# Patient Record
Sex: Male | Born: 1995 | Race: White | Hispanic: No | Marital: Single | Smoking: Never smoker
Health system: Southern US, Community
[De-identification: ages and names within clinical notes are randomized; demographics above are authoritative.]

## PROBLEM LIST (undated history)

## (undated) DIAGNOSIS — S5291XA Unspecified fracture of right forearm, initial encounter for closed fracture: Secondary | ICD-10-CM

## (undated) DIAGNOSIS — S060X9A Concussion with loss of consciousness of unspecified duration, initial encounter: Secondary | ICD-10-CM

## (undated) DIAGNOSIS — T50904A Poisoning by unspecified drugs, medicaments and biological substances, undetermined, initial encounter: Secondary | ICD-10-CM

## (undated) DIAGNOSIS — S4991XA Unspecified injury of right shoulder and upper arm, initial encounter: Secondary | ICD-10-CM

## (undated) DIAGNOSIS — S52201A Unspecified fracture of shaft of right ulna, initial encounter for closed fracture: Secondary | ICD-10-CM

## (undated) DIAGNOSIS — L709 Acne, unspecified: Secondary | ICD-10-CM

## (undated) DIAGNOSIS — K208 Other esophagitis: Secondary | ICD-10-CM

## (undated) HISTORY — DX: Unspecified fracture of shaft of right ulna, initial encounter for closed fracture: S52.201A

## (undated) HISTORY — DX: Unspecified fracture of right forearm, initial encounter for closed fracture: S52.91XA

## (undated) HISTORY — DX: Unspecified injury of right shoulder and upper arm, initial encounter: S49.91XA

## (undated) HISTORY — DX: Acne, unspecified: L70.9

## (undated) HISTORY — DX: Poisoning by unspecified drugs, medicaments and biological substances, undetermined, initial encounter: T50.904A

## (undated) HISTORY — DX: Other esophagitis: K20.8

## (undated) HISTORY — DX: Concussion with loss of consciousness of unspecified duration, initial encounter: S06.0X9A

---
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
## 2016-01-29 ENCOUNTER — Ambulatory Visit (INDEPENDENT_AMBULATORY_CARE_PROVIDER_SITE_OTHER): Payer: BLUE CROSS/BLUE SHIELD | Admitting: Internal Medicine

## 2016-01-29 ENCOUNTER — Encounter: Payer: Self-pay | Admitting: Internal Medicine

## 2016-01-29 VITALS — BP 131/75 | Ht 71.0 in | Wt 175.0 lb

## 2016-01-29 DIAGNOSIS — F4323 Adjustment disorder with mixed anxiety and depressed mood: Secondary | ICD-10-CM

## 2016-01-29 DIAGNOSIS — F902 Attention-deficit hyperactivity disorder, combined type: Secondary | ICD-10-CM | POA: Diagnosis not present

## 2016-01-31 NOTE — Progress Notes (Signed)
Adolescent Clinic Followup  From early in 1st semester senior yr at [employer] [facility]'s grades dropped and he ended with 2 D's and 2 B's. He was somewhat oppositional at home, spent a lot of time with his girlfriend and with football, didn't study, failed to turned things in. Was often discouraged to point of not caring. IB math the most difficult. But he has all college req x english and a good enough overall GPA for a full scholarship offer to [employer] for [employer].Will not be able to get into [employer] or [employer].   Mom is concerned about his psyche but J feels like things are turning around ove rthe last few weeks. Still anxious at being overwhelmed with school. Is trying to get out of this math class which he does not even need to graduate but the school is refusing--they meet with higher ups this week.  Throughout the fall there were periods of reactive emotions that interfered with studying, relationships-espec with parents, sleep, etc, and stalled his considerations of the future.   He continues to use Adderall infrequently(hates side effects) and only for prolonged tasks like writing of papers or studying for multiple tests. Classroom destractibility and retaining lectures not problematic at this point.  Mom is locked in a struggle feeling she needs to ride him to get everything done, and J interprets this as very negative, feeling hurt and then angry. This is driving them apart, and leaving Mom frustrated/upset.  Noone from couns at [facility] has ever discussed college or doing poorly in IB with him. Football was not the best experience this yr despite the st champ run. He is looking forward to lacrosse. He has not even responded to the [employer] offer tho it came in October.   See [name] ADD eval 2015 for reference See concussion fall 2016--no sequelae suspected  IMP 1 adolescent adjustment reaction 2 attention deficit disorder  PLAN -drop IB math or get tutor weekly -cont adderall prn--he has meds now from  last spring -contact [employer] and consider other similar small environments at least for year one -contract with he and Mom--she has no responsibility for his tasks unless he asks for help for next 2 weeks (she needs to believe that he can take responsibility --and fail if he doesn't while he needs to discover that Mom is really for him, not against him)  Fu 1 month

## 2016-02-19 ENCOUNTER — Ambulatory Visit (INDEPENDENT_AMBULATORY_CARE_PROVIDER_SITE_OTHER): Payer: BLUE CROSS/BLUE SHIELD | Admitting: Internal Medicine

## 2016-02-19 ENCOUNTER — Encounter: Payer: Self-pay | Admitting: Internal Medicine

## 2016-02-19 VITALS — BP 107/58 | Ht 71.0 in | Wt 175.0 lb

## 2016-02-19 DIAGNOSIS — F902 Attention-deficit hyperactivity disorder, combined type: Secondary | ICD-10-CM

## 2016-02-19 DIAGNOSIS — F4323 Adjustment disorder with mixed anxiety and depressed mood: Secondary | ICD-10-CM | POA: Diagnosis not present

## 2016-02-19 MED ORDER — AMPHETAMINE-DEXTROAMPHETAMINE 10 MG PO TABS: 10.0000 mg | ORAL_TABLET | Freq: Two times a day (BID) | ORAL | Status: DC

## 2016-02-19 NOTE — Progress Notes (Signed)
Follow-up in adolescent clinic Note last visit He continues to struggle with IB math, a course he does not need to graduate or qualify for college  Appeal to his counselors, principal and central resources [employer] administration have been unhelpful His stress level is significant--there are many times where he feels overwhelmed and felt safe2 that prevents him from beginning academic assignments and social activities. He is able to recover on his own but these episodes are several times a week. He continues to feel as if his mother personalizes his anxiousness as if it is an attack on her-he has certainly remorseful for his anger toward her and the bad feelings that it creates.  Mom reports that she is fearful that he is not doing enough work to pass and not applying himself to the needs for admission to college. He is now interested in [place] where his girlfriend is, and is remotely considering [employer] where he has a full scholarship for lacrosse. His lacrosse is taking told based on time commitment with weight lifting, review of film, and then practice often lasting until 7:30 at night. Homework is crammed into the time for bed while he is very exhausted.  In a separate interview with him alone he recognizes that he will most likely not get into colleges like [employer] or state and that he may even have to go to [employer] to get the requirements necessary to transfer to a better school. He is currently willing to live with that decision on his part although he prefers to try and accomplish all of his work to complete the semester. He expresses the need for his parents to allow him to set his own schedule for this.  He has finally tried to take his Adderall in the morning and does in fact find it helpful without side effects  IMP:  ADHD (attention deficit hyperactivity disorder), combined type  Adjustment disorder with mixed anxiety and depressed mood  Meds ordered this encounter   Medications  . amphetamine-dextroamphetamine (ADDERALL) 10 MG tablet    Sig: Take 1 tablet (10 mg total) by mouth 2 (two) times daily with a meal.    Dispense:  60 tablet    Refill:  0   He has medication left over and mother is given this prescription in case he runs out for follow-up A letter is written recommended dropping math class Mother and son are directed to establish contracts regarding expectations with rewards for freedom to choose activities She is correcting her expectations for behaviors that do not hurt the family unit He is correct in expecting more trust that he will make the correct decision They both need to agree on adverse outcomes that may require him to attend [employer] Follow-up one month

## 2016-03-15 ENCOUNTER — Encounter: Payer: Self-pay | Admitting: Family

## 2016-03-15 ENCOUNTER — Ambulatory Visit (INDEPENDENT_AMBULATORY_CARE_PROVIDER_SITE_OTHER): Payer: BLUE CROSS/BLUE SHIELD | Admitting: Family

## 2016-03-15 VITALS — Wt 178.0 lb

## 2016-03-15 DIAGNOSIS — J0110 Acute frontal sinusitis, unspecified: Secondary | ICD-10-CM | POA: Diagnosis not present

## 2016-03-15 MED ORDER — AMOXICILLIN 500 MG PO CAPS: 500.0000 mg | ORAL_CAPSULE | Freq: Two times a day (BID) | ORAL | Status: DC

## 2016-03-15 NOTE — Patient Instructions (Signed)

## 2016-03-15 NOTE — Progress Notes (Signed)
Subjective:     William Parsons is a 20 y.o. male who presents for evaluation of sinus pain. Symptoms include: congestion, cough, headaches, mouth breathing, nasal congestion, post nasal drip, puffiness of the eyes and sinus pressure. Onset of symptoms was 3 weeks ago. Symptoms have been gradually worsening since that time.  Patient is a non-smoker. Describes headache that starts with his left frontal sinus, last for an hour or two, then goes away. Ibuprofen as been helpful with headache. Denies dizziness, changes in vision, weakness.   The following portions of the patient's history were reviewed and updated as appropriate: allergies, current medications, past family history, past medical history, past social history, past surgical history and problem list.  Review of Systems Pertinent items noted in HPI and remainder of comprehensive ROS otherwise negative.   Objective:    General appearance: alert and cooperative Head: Normocephalic, without obvious abnormality, atraumatic Ears: normal TM's and external ear canals both ears Nose: moderate congestion, sinus tenderness left Throat: lips, mucosa, and tongue normal; teeth and gums normal Lungs: clear to auscultation bilaterally and normal percussion bilaterally Heart: regular rate and rhythm, S1, S2 normal, no murmur, click, rub or gallop Lymph nodes: Cervical, supraclavicular, and axillary nodes normal.    Assessment:    Acute bacterial sinusitis.    Plan:  - Amoxicillin BID x 10 days for sinusitis due to duration and severity of symptoms.  - Flonase 1 puff in each nostril BID, Zyrtec daily  - Discussed headaches, if continue, then keep headache journal for two weeks then follow up  - Follow up as needed.

## 2016-03-18 ENCOUNTER — Ambulatory Visit (INDEPENDENT_AMBULATORY_CARE_PROVIDER_SITE_OTHER): Payer: BLUE CROSS/BLUE SHIELD | Admitting: Internal Medicine

## 2016-03-18 ENCOUNTER — Encounter: Payer: Self-pay | Admitting: Internal Medicine

## 2016-03-18 VITALS — BP 128/60 | Ht 71.0 in | Wt 175.0 lb

## 2016-03-18 DIAGNOSIS — F902 Attention-deficit hyperactivity disorder, combined type: Secondary | ICD-10-CM

## 2016-03-18 DIAGNOSIS — F4323 Adjustment disorder with mixed anxiety and depressed mood: Secondary | ICD-10-CM | POA: Diagnosis not present

## 2016-03-20 NOTE — Progress Notes (Signed)
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 He is almost caught up and all his other subjects and expects to pass and do well completing this senior year. He has been for an initial interview at [employer] and was told informally that he will be accepted. They are discussing scholarship money. He hopes to play club lacrosse at college rather than varsity and to focus more on studies. All of this has greatly improved his relationship with his mother and she reports that they are working well together now much less arguing.  He rarely needs Adderall and in fact his written a paper for his English class about this drug and its potential misuses and side effects. He does not like the overfocus and the irritability and hopes to use this very seldom.  He plans to transition his regular medical care from his pediatrician to urgent medical and family care on [place] and I have suggested providers there

## 2017-12-29 HISTORY — PX: WISDOM TOOTH EXTRACTION: SHX21

## 2018-01-03 ENCOUNTER — Telehealth: Payer: Self-pay | Admitting: *Deleted

## 2018-01-03 ENCOUNTER — Encounter: Payer: Self-pay | Admitting: Family Medicine

## 2018-01-03 ENCOUNTER — Ambulatory Visit: Payer: BLUE CROSS/BLUE SHIELD | Admitting: Family Medicine

## 2018-01-03 VITALS — BP 110/80 | HR 89 | Temp 98.7°F | Ht 71.0 in | Wt 208.6 lb

## 2018-01-03 DIAGNOSIS — Z79899 Other long term (current) drug therapy: Secondary | ICD-10-CM | POA: Diagnosis not present

## 2018-01-03 DIAGNOSIS — E663 Overweight: Secondary | ICD-10-CM | POA: Diagnosis not present

## 2018-01-03 DIAGNOSIS — F902 Attention-deficit hyperactivity disorder, combined type: Secondary | ICD-10-CM | POA: Diagnosis not present

## 2018-01-03 MED ORDER — AMPHETAMINE-DEXTROAMPHETAMINE 10 MG PO TABS: 10.0000 mg | ORAL_TABLET | Freq: Two times a day (BID) | ORAL | 0 refills | Status: DC

## 2018-01-03 NOTE — Assessment & Plan Note (Signed)
S:had significant increase in weight from 185 to 215 freshman year college.   He noted this was an issue and has cut out alcohol and eating at dining hall. already down 7 lbs- cooking own meals now A/P: congratulated on progress but encouraged continued weight loss through healthy lifestyle choices.

## 2018-01-03 NOTE — Assessment & Plan Note (Signed)
S: Per records: Psych evaluation [facility] January 2015 led to diagnosis. Patient has done really well with  Adderall 10mg  BID prn. He was managed by Dr. Merla Richesoolittle. At this point he rarely has to use unless he has prolonged period of work at school- otherwise he is able to reasonably manage. 60 pills usually lasts him over 6 months.  A/P: William MuirJamie will help him complete controlled substance contract. He agrees to UDS today. Refilled adderall 10mg  BID #60 and advised CPE in 6-12 months for repeat evaluation/refill and get some baseline bloodwork.

## 2018-01-03 NOTE — Patient Instructions (Addendum)
Drop off urine before you go  Fill out controlled substance contract- William MuirJamie will be in shortly  Refilled Adderall. Check in with me 6-12 months for follow up visit and we can get some baseline bloodwork/do a physical

## 2018-01-03 NOTE — Telephone Encounter (Signed)
Copied from CRM [id number]. Topic: General - Other >> Jan 03, 2018 12:00 PM Windy KalataMichael, Taylor L, NT wrote: Reason for CRM: patient states he was just seen today and he forgot to let Dr. Durene CalHunter know he has been taking hydrocodone and ketorolac for pain that he received when he got his wisdom teeth out. He wanted to make sure there was not any confusion since he did a urine test today

## 2018-01-03 NOTE — Telephone Encounter (Signed)
Cassie- thanks for the update- will interpret test in light of this

## 2018-01-03 NOTE — Progress Notes (Signed)
Phone: [phone]  Subjective:  Patient presents today to establish care.  Prior patient of Dr. Barney Drainamgoolam of [facility] and Dr. Merla Richesoolittle for ADD. Chief complaint-noted.   See problem oriented charting  The following were reviewed and entered/updated in epic: Past Medical History:  Diagnosis Date  . Acne   . Concussion 01/2012   seen by neurology  . Esophagitis due to doxycycline 2013  . Fracture of radius with ulna, right, closed 12/2012  . [facility] 2013   [facility]   Patient Active Problem List   Diagnosis Date Noted  . ADHD (attention deficit hyperactivity disorder), combined type 12/01/2013    Priority: High  . Mild concussion 09/21/2015    Priority: Low  . Acne 03/15/2013    Priority: Low  . Back pain 03/14/2013    Priority: Low  . Esophagitis due to doxycycline 10/31/2012    Priority: Low  . Overweight 01/03/2018   Past Surgical History:  Procedure Laterality Date  . WISDOM TOOTH EXTRACTION Bilateral 12/29/2017    Family History  Problem Relation Age of Onset  . Learning disabilities Mother   . Healthy Father   . Healthy Sister   . Healthy Brother   . Learning disabilities Maternal Grandmother   . Diabetes Maternal Grandfather   . Diabetes Paternal Grandmother   . Diabetes Paternal Grandfather     Medications- reviewed and updated Current Outpatient Medications  Medication Sig Dispense Refill  . amphetamine-dextroamphetamine (ADDERALL) 10 MG tablet Take 1 tablet (10 mg total) by mouth 2 (two) times daily with a meal. 60 tablet 0   Allergies-reviewed and updated Allergies  Allergen Reactions  . Doxycycline     esophagitis    Social History   Socioeconomic History  . Marital status: Single    Spouse name: None  . Number of children: None  . Years of education: None  . Highest education level: None  Social Needs  . Financial resource strain: None  . Food insecurity - worry: None  . Food insecurity - inability: None    . Transportation needs - medical: None  . Transportation needs - non-medical: None  Occupational History  . None  Tobacco Use  . Smoking status: Never Smoker  . Smokeless tobacco: Never Used  Substance and Sexual Activity  . Alcohol use: No  . Drug use: No  . Sexual activity: No    Birth control/protection: Condom  Other Topics Concern  . None  Social History Narrative   Single. 3 roommates at college   Lives at home with mom, dad, little brother and little sister      At [employer]- sophomore 2019.    Looking at transferring to [employer]- better friend network and better for business   He is a finance/business major      Hobbies: Loves intramural sports, enjoys hiking, youtube- day in the life things- pranks/tricks    ROS--Full ROS was completed Review of Systems  Constitutional: Negative for chills and fever.  HENT: Negative for hearing loss and tinnitus.   Eyes: Negative for blurred vision and double vision.  Respiratory: Negative for cough and hemoptysis.   Cardiovascular: Negative for chest pain and palpitations.  Gastrointestinal: Negative for abdominal pain and diarrhea.  Genitourinary: Negative for dysuria and urgency.  Musculoskeletal: Negative for myalgias and neck pain.  Skin: Negative for itching and rash.  Neurological: Negative for dizziness and headaches.       Attention issues from ADD  Endo/Heme/Allergies: Negative for polydipsia. Does  not bruise/bleed easily.  Psychiatric/Behavioral: Negative for hallucinations and substance abuse.   Objective: BP 110/80 (BP Location: Left Arm, Patient Position: Sitting, Cuff Size: Large)   Pulse 89   Temp 98.7 F (37.1 C) (Oral)   Ht 5\' 11"  (1.803 m)   Wt 208 lb 9.6 oz (94.6 kg)   SpO2 96%   BMI 29.09 kg/m  Gen: NAD, resting comfortably HEENT: Mucous membranes are moist. Oropharynx normal. TM normal. Eyes: sclera and lids normal, PERRLA Neck: no thyromegaly, no cervical lymphadenopathy CV: RRR no murmurs rubs  or gallops Lungs: CTAB no crackles, wheeze, rhonchi Abdomen: soft/nontender/nondistended/normal bowel sounds. No rebound or guarding. overweight Ext: no edema Skin: warm, dry Neuro: 5/5 strength in upper and lower extremities, normal gait, normal reflexes  Assessment/Plan:  Other- always uses protection when has sex but no recent sex  ADHD (attention deficit hyperactivity disorder), combined type S: Per records: Psych evaluation [facility] January 2015 led to diagnosis. Patient has done really well with  Adderall 10mg  BID prn. He was managed by Dr. Merla Riches. At this point he rarely has to use unless he has prolonged period of work at school- otherwise he is able to reasonably manage. 60 pills usually lasts him over 6 months.  A/P: Asher Muir will help him complete controlled substance contract. He agrees to UDS today. Refilled adderall 10mg  BID #60 and advised CPE in 6-12 months for repeat evaluation/refill and get some baseline bloodwork.    Overweight S:had significant increase in weight from 185 to 215 freshman year college.   He noted this was an issue and has cut out alcohol and eating at dining hall. already down 7 lbs- cooking own meals now A/P: congratulated on progress but encouraged continued weight loss through healthy lifestyle choices.    6-12 months CPE   Orders Placed This Encounter  Procedures  . Pain Mgmt, Profile 8 w/Conf, U   Meds ordered this encounter  Medications  . amphetamine-dextroamphetamine (ADDERALL) 10 MG tablet    Sig: Take 1 tablet (10 mg total) by mouth 2 (two) times daily with a meal.    Dispense:  60 tablet    Refill:  0   Return precautions advised. Tana Conch, MD

## 2018-01-06 LAB — PAIN MGMT, PROFILE 8 W/CONF, U
6 Acetylmorphine: NEGATIVE ng/mL (ref <10)
ALCOHOL METABOLITES: NEGATIVE ng/mL (ref <500)
Amphetamines: NEGATIVE ng/mL (ref <500)
BENZODIAZEPINES: NEGATIVE ng/mL (ref <100)
BUPRENORPHINE, URINE: NEGATIVE ng/mL (ref <5)
Cocaine Metabolite: NEGATIVE ng/mL (ref <150)
Codeine: NEGATIVE ng/mL (ref <50)
Creatinine: 61.5 mg/dL
HYDROMORPHONE: 51 ng/mL — AB (ref <50)
Hydrocodone: 54 ng/mL — ABNORMAL HIGH (ref <50)
MDMA: NEGATIVE ng/mL (ref <500)
MORPHINE: NEGATIVE ng/mL (ref <50)
Marijuana Metabolite: NEGATIVE ng/mL (ref <20)
Norhydrocodone: 99 ng/mL — ABNORMAL HIGH (ref <50)
Opiates: POSITIVE ng/mL — AB (ref <100)
Oxidant: NEGATIVE ug/mL (ref <200)
Oxycodone: NEGATIVE ng/mL (ref <100)
pH: 6.91 (ref 4.5–9.0)

## 2018-05-10 ENCOUNTER — Encounter: Payer: Self-pay | Admitting: Physician Assistant

## 2018-05-10 ENCOUNTER — Ambulatory Visit: Payer: BLUE CROSS/BLUE SHIELD | Admitting: Physician Assistant

## 2018-05-10 VITALS — BP 140/76 | HR 92 | Temp 99.1°F | Ht 71.0 in | Wt 191.4 lb

## 2018-05-10 DIAGNOSIS — J029 Acute pharyngitis, unspecified: Secondary | ICD-10-CM

## 2018-05-10 LAB — POCT RAPID STREP A (OFFICE): Rapid Strep A Screen: NEGATIVE

## 2018-05-10 MED ORDER — IBUPROFEN 600 MG PO TABS: 600.0000 mg | ORAL_TABLET | Freq: Four times a day (QID) | ORAL | 0 refills | Status: DC | PRN

## 2018-05-10 NOTE — Progress Notes (Signed)
William Parsons is a 22 y.o. male here for a new problem.  I acted as a Neurosurgeon for [employer], PA-C Corky Mull, LPN  History of Present Illness:   Chief Complaint  Patient presents with  . Sore Throat    Sore Throat   This is a new problem. The current episode started yesterday (Pt has been feeling bad since Sunday night with fatigue and Fever and then sore throat started ). The problem has been gradually worsening. The maximum temperature recorded prior to his arrival was 101 - 101.9 F. The fever has been present for 1 to 2 days. The pain is at a severity of 5/10. The pain is moderate. Associated symptoms include coughing (slight), headaches and trouble swallowing. Pertinent negatives include no ear pain, neck pain or shortness of breath. He has tried cool liquids (Ibuprofen took last at 4:30 AM) for the symptoms. The treatment provided mild relief.    Past Medical History:  Diagnosis Date  . Acne   . Concussion 01/2012   seen by neurology  . Esophagitis due to doxycycline 2013  . Fracture of radius with ulna, right, closed 12/2012  . Right shoulder injury 2013   [facility]     Social History   Socioeconomic History  . Marital status: Single    Spouse name: Not on file  . Number of children: Not on file  . Years of education: Not on file  . Highest education level: Not on file  Occupational History  . Not on file  Social Needs  . Financial resource strain: Not on file  . Food insecurity:    Worry: Not on file    Inability: Not on file  . Transportation needs:    Medical: Not on file    Non-medical: Not on file  Tobacco Use  . Smoking status: Never Smoker  . Smokeless tobacco: Never Used  Substance and Sexual Activity  . Alcohol use: No  . Drug use: No  . Sexual activity: Never    Birth control/protection: Condom  Lifestyle  . Physical activity:    Days per week: Not on file    Minutes per session: Not on file  . Stress: Not on file  Relationships   . Social connections:    Talks on phone: Not on file    Gets together: Not on file    Attends religious service: Not on file    Active member of club or organization: Not on file    Attends meetings of clubs or organizations: Not on file    Relationship status: Not on file  . Intimate partner violence:    Fear of current or ex partner: Not on file    Emotionally abused: Not on file    Physically abused: Not on file    Forced sexual activity: Not on file  Other Topics Concern  . Not on file  Social History Narrative   Single. 3 roommates at college   Lives at home with mom, dad, little brother and little sister      At [employer]- sophomore 2019.    Looking at transferring to [employer]- better friend network and better for business   He is a finance/business major      Hobbies: Loves intramural sports, enjoys hiking, youtube- day in the life things- pranks/tricks    Past Surgical History:  Procedure Laterality Date  . WISDOM TOOTH EXTRACTION Bilateral 12/29/2017    Family History  Problem Relation Age of  Onset  . Learning disabilities Mother   . Healthy Father   . Healthy Sister   . Healthy Brother   . Learning disabilities Maternal Grandmother   . Diabetes Maternal Grandfather   . Diabetes Paternal Grandmother   . Diabetes Paternal Grandfather     Allergies  Allergen Reactions  . Doxycycline     esophagitis    Current Medications:   Current Outpatient Medications:  .  amphetamine-dextroamphetamine (ADDERALL) 10 MG tablet, Take 1 tablet (10 mg total) by mouth 2 (two) times daily with a meal., Disp: 60 tablet, Rfl: 0 .  ibuprofen (ADVIL,MOTRIN) 600 MG tablet, Take 1 tablet (600 mg total) by mouth every 6 (six) hours as needed., Disp: 30 tablet, Rfl: 0   Review of Systems:   Review of Systems  HENT: Positive for trouble swallowing. Negative for ear pain.   Respiratory: Positive for cough (slight). Negative for shortness of breath.   Musculoskeletal: Negative  for neck pain.  Neurological: Positive for headaches.    Vitals:   Vitals:   05/10/18 1034  BP: 140/76  Pulse: 92  Temp: 99.1 F (37.3 C)  TempSrc: Oral  SpO2: 97%  Weight: 191 lb 6.1 oz (86.8 kg)  Height:  (1.803 m)     Body mass index is 26.69 kg/m.  Physical Exam:   Physical Exam  Constitutional: He appears well-developed. He is cooperative.  Non-toxic appearance. He does not have a sickly appearance. He does not appear ill. No distress.  HENT:  Head: Normocephalic and atraumatic.  Right Ear: Tympanic membrane, external ear and ear canal normal. Tympanic membrane is not erythematous, not retracted and not bulging.  Left Ear: Tympanic membrane, external ear and ear canal normal. Tympanic membrane is not erythematous, not retracted and not bulging.  Nose: Nose normal. Right sinus exhibits no maxillary sinus tenderness and no frontal sinus tenderness. Left sinus exhibits no maxillary sinus tenderness and no frontal sinus tenderness.  Mouth/Throat: Uvula is midline and mucous membranes are normal. Posterior oropharyngeal edema and posterior oropharyngeal erythema present. Tonsils are 2+ on the right. Tonsils are 2+ on the left.  Eyes: Conjunctivae and lids are normal.  Neck: Trachea normal.  Cardiovascular: Normal rate, regular rhythm, S1 normal, S2 normal and normal heart sounds.  Pulmonary/Chest: Effort normal and breath sounds normal. He has no decreased breath sounds. He has no wheezes. He has no rhonchi. He has no rales.  Lymphadenopathy:    He has no cervical adenopathy.  Neurological: He is alert.  Skin: Skin is warm, dry and intact.  Psychiatric: He has a normal mood and affect. His speech is normal and behavior is normal.  Nursing note and vitals reviewed.  Results for orders placed or performed in visit on 05/10/18  POCT rapid strep A  Result Value Ref Range   Rapid Strep A Screen Negative Negative     Assessment and Plan:    Donna was seen today  for sore throat.  Diagnoses and all orders for this visit:  Pharyngitis, unspecified etiology -     POCT rapid strep A  Other orders -     ibuprofen (ADVIL,MOTRIN) 600 MG tablet; Take 1 tablet (600 mg total) by mouth every 6 (six) hours as needed.   No red flags on exam. Suspect viral etiology. I offered testing for mono but he declined.  Will initiate symptomatic care per orders -- discussed use of ibuprofen (I did provide patient with a prescription of 600 mg ibuprofen.) Discussed taking medications as  prescribed. Reviewed return precautions including worsening fever, SOB, worsening cough or other concerns. Push fluids and rest. I recommend that patient follow-up if symptoms worsen or persist despite treatment x 7-10 days, sooner if needed.   . Reviewed expectations re: course of current medical issues. . Discussed self-management of symptoms. . Outlined signs and symptoms indicating need for more acute intervention. . Patient verbalized understanding and all questions were answered. . See orders for this visit as documented in the electronic medical record. . Patient received an After-Visit Summary.  CMA or LPN served as scribe during this visit. History, Physical, and Plan performed by medical provider. Documentation and orders reviewed and attested to.  Jarold Motto, PA-C

## 2018-05-10 NOTE — Patient Instructions (Addendum)
It was great to see you!  Use medication as prescribed: ibuprofen Push fluids and get plenty of rest. Please return if you are not improving as expected, or if you have high fevers (>101.5) or difficulty swallowing or worsening productive cough.  Call clinic with questions.  I hope you start feeling better soon!

## 2018-07-18 ENCOUNTER — Ambulatory Visit: Payer: BLUE CROSS/BLUE SHIELD | Admitting: Family Medicine

## 2018-07-18 ENCOUNTER — Encounter: Payer: Self-pay | Admitting: Family Medicine

## 2018-07-18 VITALS — BP 110/74 | HR 69 | Temp 98.4°F | Ht 71.0 in | Wt 191.0 lb

## 2018-07-18 DIAGNOSIS — F902 Attention-deficit hyperactivity disorder, combined type: Secondary | ICD-10-CM | POA: Diagnosis not present

## 2018-07-18 DIAGNOSIS — H60332 Swimmer's ear, left ear: Secondary | ICD-10-CM

## 2018-07-18 DIAGNOSIS — M545 Low back pain, unspecified: Secondary | ICD-10-CM

## 2018-07-18 MED ORDER — NEOMYCIN-POLYMYXIN-HC 3.5-10000-1 OT SOLN: 4.0000 [drp] | Freq: Four times a day (QID) | OTIC | 0 refills | Status: DC

## 2018-07-18 MED ORDER — AMPHETAMINE-DEXTROAMPHETAMINE 10 MG PO TABS: 10.0000 mg | ORAL_TABLET | Freq: Two times a day (BID) | ORAL | 0 refills | Status: DC

## 2018-07-18 NOTE — Assessment & Plan Note (Signed)
S: Patient is about to run out of #60 of Adderall he was given 6 months ago.  He does not use medicine for frequently in the summer but is about to go back to school where he will use more frequently.  He has a hard semester upcoming and suspects he will need to use the medication more regularly.  Medication is continued to work well for him without side effects A/P: Good control-refill for 3 months of medication.  He agrees to pick this up at local pharmacy each time refill needed.  I suspect these 3 prescriptions may last him 6 months even. -UDS completed last visit.  On opiates for dental procedure - Controlled substance contract signed last visit. -Original diagnosis through [facility] January 2015

## 2018-07-18 NOTE — Progress Notes (Signed)
Subjective:  William Parsons is a 22 y.o. year old very pleasant male patient who presents for/with See problem oriented charting ROS-notes left ear pain.  No fever or chills.  Mild hoarse voice.  Denies sinus pressure and nasal congestion.  Past Medical History-  Patient Active Problem List   Diagnosis Date Noted  . ADHD (attention deficit hyperactivity disorder), combined type 12/01/2013    Priority: High  . Mild concussion 09/21/2015    Priority: Low  . Acne 03/15/2013    Priority: Low  . Back pain 03/14/2013    Priority: Low  . Esophagitis due to doxycycline 10/31/2012    Priority: Low  . Overweight 01/03/2018    Medications- reviewed and updated Current Outpatient Medications  Medication Sig Dispense Refill  . amphetamine-dextroamphetamine (ADDERALL) 10 MG tablet Take 1 tablet (10 mg total) by mouth 2 (two) times daily with a meal. 60 tablet 0  . ibuprofen (ADVIL,MOTRIN) 600 MG tablet Take 1 tablet (600 mg total) by mouth every 6 (six) hours as needed. 30 tablet 0   No current facility-administered medications for this visit.     Objective: BP 110/74 (BP Location: Left Arm, Patient Position: Sitting, Cuff Size: Normal)   Pulse 69   Temp 98.4 F (36.9 C) (Oral)   Ht 5\' 11"  (1.803 m)   Wt 191 lb (86.6 kg)   SpO2 98%   BMI 26.64 kg/m  Gen: NAD, resting comfortably Tm normal on right as is canal, on left side- there is erythema and edema in canal as well as light white discharge.  CV: RRR no murmurs rubs or gallops Lungs: CTAB no crackles, wheeze, rhonchi Ext: no edema  Back - Normal skin, Spine with normal alignment and no deformity.  No tenderness to vertebral process palpation.  Paraspinous muscles are not tender and without spasm on right, on left in low back there is some tenderness and spasm.   Range of motion is full at neck and lumbar sacral regions.  Neuro- no saddle anesthesia, 5/5 strength lower extremities  Assessment/Plan:  Left low back pain S:  February tweaked back- started with heat and seemed to make him feel too loose  Yesterday noted pain in same area. Threw a 160 lb friend in the pool and felt immediate pain. Pain up to 5-6/10 in left low back. Today pain is 3/10 but stiff.  A/P: 22 year old with low back strain. No red flags such as incontinence or leg weakness or midline pain. we discussed relative rest for next 2-3 weeks. I want you to ice your left low back 3x a day for 20 minutes. After 3 days I want you to try heat 3x a day for 20 minutes.   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 [place] Jumped in pool 4-5 days ago and within a day noted some pressure in left ear- this has increased through today. Hurts with pressing on tragus A/P:  ADHD (attention deficit hyperactivity disorder), combined type S: Patient is about to run out of #60 of Adderall he was given 6 months ago.  He does not use medicine for frequently in the summer but is about to go back to school where he will use more frequently.  He has a hard semester upcoming and suspects he will need to use the medication more regularly.  Medication is continued to work well for him without side effects A/P: Good control-refill for 3 months  of medication.  He agrees to pick this up at local pharmacy each time refill needed.  I suspect these 3 prescriptions may last him 6 months even. -UDS completed last visit.  On opiates for dental procedure - Controlled substance contract signed last visit. -Original diagnosis through [facility] January 2015   Meds ordered this encounter  Medications  . neomycin-polymyxin-hydrocortisone (CORTISPORIN) OTIC solution    Sig: Place 4 drops into the left ear 4 (four) times daily.    Dispense:  10 mL    Refill:  0  . amphetamine-dextroamphetamine (ADDERALL) 10 MG tablet    Sig: Take 1 tablet (10 mg total) by mouth 2 (two) times daily.    Dispense:  60 tablet     Refill:  0  . amphetamine-dextroamphetamine (ADDERALL) 10 MG tablet    Sig: Take 1 tablet (10 mg total) by mouth 2 (two) times daily.    Dispense:  60 tablet    Refill:  0  . amphetamine-dextroamphetamine (ADDERALL) 10 MG tablet    Sig: Take 1 tablet (10 mg total) by mouth 2 (two) times daily.    Dispense:  60 tablet    Refill:  0    Return precautions advised.  Tana Conch, MD

## 2018-07-18 NOTE — Patient Instructions (Addendum)
I want you to ice your left low back 3x a day for 20 minutes. After 3 days I want you to try heat 3x a day for 20 minutes.   Use ear drops 7-10 days. See us back if not improving by the end of the week  Refilled ADD meds today  Hope you have a great school year!

## 2019-07-20 DIAGNOSIS — Z20828 Contact with and (suspected) exposure to other viral communicable diseases: Secondary | ICD-10-CM | POA: Diagnosis not present

## 2019-07-23 ENCOUNTER — Other Ambulatory Visit: Payer: Self-pay | Admitting: Family Medicine

## 2020-06-16 DIAGNOSIS — J00 Acute nasopharyngitis [common cold]: Secondary | ICD-10-CM | POA: Diagnosis not present

## 2020-06-16 DIAGNOSIS — R05 Cough: Secondary | ICD-10-CM | POA: Diagnosis not present

## 2020-07-18 ENCOUNTER — Other Ambulatory Visit: Payer: Self-pay | Admitting: Family Medicine

## 2020-07-18 NOTE — Telephone Encounter (Signed)
Rx refill request for Adderall 10 mg Tabs  LAST APPOINTMENT DATE: 07/18/2018   NEXT APPOINTMENT DATE: Visit date not found    LAST REFILL: 07/18/2018  QTY: 60 tablets

## 2020-07-21 NOTE — Telephone Encounter (Signed)
Pt needs OV In order for medication refill.

## 2020-07-21 NOTE — Telephone Encounter (Signed)
Needs visit per refill protocol. Please review protocol William Parsons. Thanks for helping out

## 2020-07-24 NOTE — Telephone Encounter (Signed)
Patient is scheduled for December 28th for a physical, next available for an appointment is not going to be until October. William Parsons asked if there is a way that he can get a refill to last until December, states that he is starting school back in August.

## 2020-07-24 NOTE — Telephone Encounter (Signed)
Please see if he can do a 10 AM visit on the 30th/Friday or 11:40 AM on Monday?

## 2020-07-25 NOTE — Telephone Encounter (Signed)
LVM asking patient to return my call -asking patient to come in on Monday.

## 2020-07-25 NOTE — Telephone Encounter (Signed)
Please call the patient to see if he is able to do a visit on Monday at 11:40 in order to get a refill.

## 2020-07-28 ENCOUNTER — Other Ambulatory Visit: Payer: Self-pay

## 2020-07-28 ENCOUNTER — Ambulatory Visit (INDEPENDENT_AMBULATORY_CARE_PROVIDER_SITE_OTHER): Payer: BC Managed Care – PPO | Admitting: Family Medicine

## 2020-07-28 ENCOUNTER — Encounter: Payer: Self-pay | Admitting: Family Medicine

## 2020-07-28 VITALS — BP 112/70 | HR 76 | Temp 99.3°F | Resp 18 | Ht 71.0 in | Wt 199.2 lb

## 2020-07-28 DIAGNOSIS — Z1159 Encounter for screening for other viral diseases: Secondary | ICD-10-CM | POA: Diagnosis not present

## 2020-07-28 DIAGNOSIS — F988 Other specified behavioral and emotional disorders with onset usually occurring in childhood and adolescence: Secondary | ICD-10-CM

## 2020-07-28 DIAGNOSIS — Z9189 Other specified personal risk factors, not elsewhere classified: Secondary | ICD-10-CM | POA: Diagnosis not present

## 2020-07-28 DIAGNOSIS — Z114 Encounter for screening for human immunodeficiency virus [HIV]: Secondary | ICD-10-CM

## 2020-07-28 DIAGNOSIS — Z79899 Other long term (current) drug therapy: Secondary | ICD-10-CM | POA: Diagnosis not present

## 2020-07-28 DIAGNOSIS — Z13220 Encounter for screening for lipoid disorders: Secondary | ICD-10-CM

## 2020-07-28 MED ORDER — AMPHETAMINE-DEXTROAMPHETAMINE 10 MG PO TABS: 10.0000 mg | ORAL_TABLET | Freq: Two times a day (BID) | ORAL | 0 refills | Status: DC

## 2020-07-28 NOTE — Patient Instructions (Addendum)
Health Maintenance Due  Topic Date Due   Hepatitis C Screening - team please order Never done   HIV Screening - team please order Never done   TETANUS/TDAP - consider getting this 2 weeks after you finish your covid shots.  12/11/2018   INFLUENZA VACCINE - consider late in the fall perhaps october 07/27/2020   Team please order uds under high risk medication use  Can refill meds at 3 months by phone or mychart  then need to see each other for 6 monthrefill  Please stop by lab before you go If you have mychart- we will send your results within 3 business days of [place] receiving them.  If you do not have mychart- we will call you about results within 5 business days of [place] receiving them.  *please note we are currently using [facility] which has a longer processing time than [place] typically so labs may not come back as quickly as in the past *please also note that you will see labs on mychart as soon as they post. I will later go in and write notes on them- will say "notes from Dr. Durene Cal"  Recommended follow up: Return in about 6 months (around 01/28/2021) for physical or sooner if needed.

## 2020-07-28 NOTE — Addendum Note (Signed)
Addended by: Donnamarie Poag on: 07/28/2020 01:22 PM   Modules accepted: Orders

## 2020-07-28 NOTE — Progress Notes (Signed)
Phone [phone] In person visit   Subjective:   William Parsons is a 24 y.o. year old very pleasant male patient who presents for/with See problem oriented charting Chief Complaint  Patient presents with  . ADHD    This visit occurred during the SARS-CoV-2 public health emergency.  Safety protocols were in place, including screening questions prior to the visit, additional usage of staff PPE, and extensive cleaning of exam room while observing appropriate contact time as indicated for disinfecting solutions.   Past Medical History-  Patient Active Problem List   Diagnosis Date Noted  . ADHD (attention deficit hyperactivity disorder), combined type 12/01/2013    Priority: High  . Mild concussion 09/21/2015    Priority: Low  . Acne 03/15/2013    Priority: Low  . Back pain 03/14/2013    Priority: Low  . Esophagitis due to doxycycline 10/31/2012    Priority: Low  . Overweight 01/03/2018    Medications- reviewed and updated Current Outpatient Medications  Medication Sig Dispense Refill  . ibuprofen (ADVIL,MOTRIN) 600 MG tablet Take 1 tablet (600 mg total) by mouth every 6 (six) hours as needed. 30 tablet 0  . amphetamine-dextroamphetamine (ADDERALL) 10 MG tablet Take 1 tablet (10 mg total) by mouth 2 (two) times daily with a meal. 60 tablet 0  . [START ON 08/27/2020] amphetamine-dextroamphetamine (ADDERALL) 10 MG tablet Take 1 tablet (10 mg total) by mouth 2 (two) times daily with a meal. 60 tablet 0  . [START ON 09/26/2020] amphetamine-dextroamphetamine (ADDERALL) 10 MG tablet Take 1 tablet (10 mg total) by mouth 2 (two) times daily with a meal. 60 tablet 0   No current facility-administered medications for this visit.     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 Struggles more in school and starts back at school in the fall.  Has been off for 5-6 months. Doesn't tolerate stimulants in summer months when outside a lot with his job. House has been messier/in more disorder.    -does CBD gummies and has done some reading may have cross positive results. Took several weeks off drinking to see if that helps with weight- the cbd gummies and sparkling waters have helped curb desire for alcohol.also help with anxiety.   A/P: Mild poor control off medications and developed to start back at school which seems to be to this challenge for him-we will restart Adderall 10 mg twice daily for school - original diagnosis January 2015 [facility] - initially managed by [facility] -controlled substance contract 01/03/2018 per my note -See AVS-asked team to order urine drug screen.  If not completed will complete at next visit.  I do want her CBD Gummies could cause a positive -No recent updated labs so he agreed to do these today  Recommended follow up: Return in about 6 months (around 01/28/2021) for physical or sooner if needed. Future Appointments  Date Time Provider Department Center  10/16/2020  3:30 PM [facility] NURSE [facility]  12/23/2020  1:20 PM Durene Cal, Aldine Contes, MD [facility]    Lab/Order associations:   ICD-10-CM   1. ADHD (attention deficit hyperactivity disorder), combined type  F90.2   2. Attention deficit disorder (ADD) in adult  F98.8 CBC with Differential/Platelet    Comprehensive metabolic panel  3. Screening for hyperlipidemia  Z13.220 Lipid panel  4. Screening for HIV (human immunodeficiency virus)  Z11.4 HIV Antibody (routine testing w rflx)  5. Encounter for hepatitis C virus screening test for high  risk patient  Z11.59 Hepatitis C antibody   Z91.89    Meds ordered this encounter  Medications  . amphetamine-dextroamphetamine (ADDERALL) 10 MG tablet    Sig: Take 1 tablet (10 mg total) by mouth 2 (two) times daily with a meal.    Dispense:  60 tablet    Refill:  0  . amphetamine-dextroamphetamine (ADDERALL) 10 MG tablet    Sig: Take 1 tablet (10 mg total) by mouth 2 (two) times daily with a meal.    Dispense:  60 tablet    Refill:  0  . amphetamine-dextroamphetamine (ADDERALL) 10 MG tablet    Sig: Take 1 tablet (10 mg total) by mouth 2 (two) times daily with a meal.    Dispense:  60 tablet    Refill:  0    Return precautions advised.  Tana Conch, MD

## 2020-07-28 NOTE — Addendum Note (Signed)
Addended by: Donnamarie Poag on: 07/28/2020 01:20 PM   Modules accepted: Orders

## 2020-07-28 NOTE — Addendum Note (Signed)
Addended by: Altamese Cabal on: 07/28/2020 01:24 PM   Modules accepted: Orders

## 2020-07-30 LAB — LIPID PANEL
Cholesterol: 188 mg/dL (ref <200)
HDL: 36 mg/dL — ABNORMAL LOW (ref >=40)
LDL Cholesterol (Calc): 132 mg/dL (calc) — ABNORMAL HIGH
Non-HDL Cholesterol (Calc): 152 mg/dL (calc) — ABNORMAL HIGH (ref <130)
Total CHOL/HDL Ratio: 5.2 (calc) — ABNORMAL HIGH (ref <5.0)
Triglycerides: 95 mg/dL (ref <150)

## 2020-07-30 LAB — DRUG MONITORING, PANEL 8 WITH CONFIRMATION, URINE
6 Acetylmorphine: NEGATIVE ng/mL (ref <10)
Alcohol Metabolites: POSITIVE ng/mL — AB
Amphetamines: NEGATIVE ng/mL (ref <500)
Benzodiazepines: NEGATIVE ng/mL (ref <100)
Buprenorphine, Urine: NEGATIVE ng/mL (ref <5)
Cocaine Metabolite: NEGATIVE ng/mL (ref <150)
Creatinine: 230 mg/dL
Ethyl Glucuronide (ETG): 2479 ng/mL — ABNORMAL HIGH (ref <500)
Ethyl Sulfate (ETS): 540 ng/mL — ABNORMAL HIGH (ref <100)
MDMA: NEGATIVE ng/mL (ref <500)
Marijuana Metabolite: 226 ng/mL — ABNORMAL HIGH (ref <5)
Marijuana Metabolite: POSITIVE ng/mL — AB (ref <20)
Opiates: NEGATIVE ng/mL (ref <100)
Oxidant: NEGATIVE ug/mL
Oxycodone: NEGATIVE ng/mL (ref <100)
pH: 8 (ref 4.5–9.0)

## 2020-07-30 LAB — DM TEMPLATE

## 2020-07-30 LAB — CBC WITH DIFFERENTIAL/PLATELET
Absolute Monocytes: 649 cells/uL (ref 200–950)
Basophils Absolute: 18 cells/uL (ref 0–200)
Basophils Relative: 0.3 %
Eosinophils Absolute: 171 cells/uL (ref 15–500)
Eosinophils Relative: 2.9 %
HCT: 47.3 % (ref 38.5–50.0)
Hemoglobin: 16 g/dL (ref 13.2–17.1)
Lymphs Abs: 1564 cells/uL (ref 850–3900)
MCH: 31.7 pg (ref 27.0–33.0)
MCHC: 33.8 g/dL (ref 32.0–36.0)
MCV: 93.8 fL (ref 80.0–100.0)
MPV: 12 fL (ref 7.5–12.5)
Monocytes Relative: 11 %
Neutro Abs: 3499 cells/uL (ref 1500–7800)
Neutrophils Relative %: 59.3 %
Platelets: 206 10*3/uL (ref 140–400)
RBC: 5.04 10*6/uL (ref 4.20–5.80)
RDW: 12.6 % (ref 11.0–15.0)
Total Lymphocyte: 26.5 %
WBC: 5.9 10*3/uL (ref 3.8–10.8)

## 2020-07-30 LAB — COMPREHENSIVE METABOLIC PANEL
AG Ratio: 2 (calc) (ref 1.0–2.5)
ALT: 29 U/L (ref 9–46)
AST: 15 U/L (ref 10–40)
Albumin: 4.9 g/dL (ref 3.6–5.1)
Alkaline phosphatase (APISO): 55 U/L (ref 36–130)
BUN: 15 mg/dL (ref 7–25)
CO2: 29 mmol/L (ref 20–32)
Calcium: 9.9 mg/dL (ref 8.6–10.3)
Chloride: 104 mmol/L (ref 98–110)
Creat: 0.84 mg/dL (ref 0.60–1.35)
Globulin: 2.4 g/dL (calc) (ref 1.9–3.7)
Glucose, Bld: 89 mg/dL (ref 65–99)
Potassium: 4.5 mmol/L (ref 3.5–5.3)
Sodium: 139 mmol/L (ref 135–146)
Total Bilirubin: 0.7 mg/dL (ref 0.2–1.2)
Total Protein: 7.3 g/dL (ref 6.1–8.1)

## 2020-07-30 LAB — HEPATITIS C ANTIBODY
Hepatitis C Ab: NONREACTIVE
SIGNAL TO CUT-OFF: 0.01 (ref <1.00)

## 2020-07-30 LAB — HIV ANTIBODY (ROUTINE TESTING W REFLEX): HIV 1&2 Ab, 4th Generation: NONREACTIVE

## 2020-07-31 NOTE — Telephone Encounter (Signed)
This has been filled duplicate will not let me refuse.

## 2020-09-24 NOTE — Patient Instructions (Addendum)
Health Maintenance Due  Topic Date Due  . TETANUS/TDAP - declines for today. If you get a cut/scrape please get this updated 12/11/2018  . INFLUENZA VACCINE - declines for now, let us know if you get this and we will update chart 07/27/2020   Please stop by lab before you go If you have mychart- we will send your results within 3 business days of [place] receiving them.  If you do not have mychart- we will call you about results within 5 business days of [place] receiving them.  *please note we are currently using [facility] which has a longer processing time than [place] typically so labs may not come back as quickly as in the past *please also note that you will see labs on mychart as soon as they post. I will later go in and write notes on them- will say "notes from Dr. Durene Cal"  We can fill meds in another 3 months as long as normal UDS today as expected  Recommended follow up: can keep January physical or push out to 6 months from today if he prefers

## 2020-09-24 NOTE — Progress Notes (Signed)
Phone [phone] In person visit   Subjective:   William Parsons is a 24 y.o. year old very pleasant male patient who presents for/with See problem oriented charting Chief Complaint  Patient presents with  . ADHD  . Medication Refill   This visit occurred during the SARS-CoV-2 public health emergency.  Safety protocols were in place, including screening questions prior to the visit, additional usage of staff PPE, and extensive cleaning of exam room while observing appropriate contact time as indicated for disinfecting solutions.   Past Medical History-  Patient Active Problem List   Diagnosis Date Noted  . ADHD (attention deficit hyperactivity disorder), combined type 12/01/2013    Priority: High  . Mild concussion 09/21/2015    Priority: Low  . Acne 03/15/2013    Priority: Low  . Back pain 03/14/2013    Priority: Low  . Esophagitis due to doxycycline 10/31/2012    Priority: Low  . Overweight 01/03/2018    Medications- reviewed and updated Current Outpatient Medications  Medication Sig Dispense Refill  . amphetamine-dextroamphetamine (ADDERALL) 10 MG tablet Take 1 tablet (10 mg total) by mouth 2 (two) times daily with a meal. 60 tablet 0  . [START ON 10/26/2020] amphetamine-dextroamphetamine (ADDERALL) 10 MG tablet Take 1 tablet (10 mg total) by mouth 2 (two) times daily with a meal. 60 tablet 0  . [START ON 11/25/2020] amphetamine-dextroamphetamine (ADDERALL) 10 MG tablet Take 1 tablet (10 mg total) by mouth 2 (two) times daily with a meal. 60 tablet 0  . ibuprofen (ADVIL,MOTRIN) 600 MG tablet Take 1 tablet (600 mg total) by mouth every 6 (six) hours as needed. (Patient not taking: Reported on 09/26/2020) 30 tablet 0   No current facility-administered medications for this visit.     Objective:  BP 121/77   Pulse 67   Temp 98 F (36.7 C) (Temporal)   Ht 5\' 11"  (1.803 m)   Wt 207 lb (93.9 kg)   SpO2 98%   BMI 28.87 kg/m  Gen: NAD, resting comfortably CV: RRR no  murmurs rubs or gallops Lungs: CTAB no crackles, wheeze, rhonchi Abdomen: soft/nontender/nondistended/normal bowel sounds.  Ext: no edema Skin: warm, dry     Assessment and Plan   #Adult ADD S: At last visit patient reported using CBD Gummies-his UDS was positive for marijuana.  He was instructed to discontinue CBD Gummies in case there is cross-reactivity.  Plan was for repeat UDS today.  Patient reports excellent control/180   School is going really well. He states the gap year was really helpful for him. He also states the instant release is really helping him when he take it consistently  No palpitations, unintentional weight loss, nausea.  A/P: Adult ADD is very well controlled on Adderall instant release twice daily-refill medication today -Urine drug screen today-he has stopped CBD Gummies which may have had cross-reactivity and caused positive on last urine drug screen.  He denies prior or present marijuana use. -Original diagnosis January 2015 [facility] -Initially managed by Dr. February 2015 -Controlled substance contract January 03, 2018 per my note - reviewed PDMP  -had some alcohol last night but knows to skip a dose of adderall or wait at least 6 hours after dose to drink  # weight gain- encouraged him to get back under 200 by follow up- plans to restart exercise  Recommended follow up: can keep January physical or push out to 6 months from today if he prefers Future Appointments  Date Time Provider Department Center  10/16/2020  3:30 PM [facility] NURSE [facility]  12/29/2020 10:40 AM Ami Mally, Aldine Contes, MD [facility]   Lab/Order associations:   ICD-10-CM   1. ADHD (attention deficit hyperactivity disorder), combined type  F90.2   2. High risk medication use  Z79.899 DRUG MONITORING, PANEL 8 WITH CONFIRMATION, URINE    Meds ordered this encounter  Medications  . amphetamine-dextroamphetamine (ADDERALL) 10 MG tablet    Sig: Take 1 tablet (10 mg  total) by mouth 2 (two) times daily with a meal.    Dispense:  60 tablet    Refill:  0  . amphetamine-dextroamphetamine (ADDERALL) 10 MG tablet    Sig: Take 1 tablet (10 mg total) by mouth 2 (two) times daily with a meal.    Dispense:  60 tablet    Refill:  0  . amphetamine-dextroamphetamine (ADDERALL) 10 MG tablet    Sig: Take 1 tablet (10 mg total) by mouth 2 (two) times daily with a meal.    Dispense:  60 tablet    Refill:  0    Return precautions advised.  Tana Conch, MD

## 2020-09-26 ENCOUNTER — Other Ambulatory Visit: Payer: Self-pay

## 2020-09-26 ENCOUNTER — Ambulatory Visit (INDEPENDENT_AMBULATORY_CARE_PROVIDER_SITE_OTHER): Payer: BC Managed Care – PPO | Admitting: Family Medicine

## 2020-09-26 ENCOUNTER — Encounter: Payer: Self-pay | Admitting: Family Medicine

## 2020-09-26 VITALS — BP 121/77 | HR 67 | Temp 98.0°F | Ht 71.0 in | Wt 207.0 lb

## 2020-09-26 DIAGNOSIS — Z79899 Other long term (current) drug therapy: Secondary | ICD-10-CM | POA: Diagnosis not present

## 2020-09-26 DIAGNOSIS — F902 Attention-deficit hyperactivity disorder, combined type: Secondary | ICD-10-CM

## 2020-09-26 MED ORDER — AMPHETAMINE-DEXTROAMPHETAMINE 10 MG PO TABS: 10.0000 mg | ORAL_TABLET | Freq: Two times a day (BID) | ORAL | 0 refills | Status: DC

## 2020-09-27 LAB — DRUG MONITORING, PANEL 8 WITH CONFIRMATION, URINE
6 Acetylmorphine: NEGATIVE ng/mL (ref <10)
Alcohol Metabolites: NEGATIVE ng/mL
Amphetamines: NEGATIVE ng/mL (ref <500)
Benzodiazepines: NEGATIVE ng/mL (ref <100)
Buprenorphine, Urine: NEGATIVE ng/mL (ref <5)
Cocaine Metabolite: NEGATIVE ng/mL (ref <150)
Creatinine: 14.8 mg/dL
MDMA: NEGATIVE ng/mL (ref <500)
Marijuana Metabolite: NEGATIVE ng/mL (ref <20)
Opiates: NEGATIVE ng/mL (ref <100)
Oxidant: NEGATIVE ug/mL
Oxycodone: NEGATIVE ng/mL (ref <100)
Specific Gravity: 1.001 — ABNORMAL LOW (ref >=1.0)
pH: 6.6 (ref 4.5–9.0)

## 2020-09-27 LAB — DM TEMPLATE

## 2020-10-16 ENCOUNTER — Ambulatory Visit: Payer: BC Managed Care – PPO

## 2020-11-17 DIAGNOSIS — Z20828 Contact with and (suspected) exposure to other viral communicable diseases: Secondary | ICD-10-CM | POA: Diagnosis not present

## 2020-11-17 DIAGNOSIS — J101 Influenza due to other identified influenza virus with other respiratory manifestations: Secondary | ICD-10-CM | POA: Diagnosis not present

## 2020-12-23 ENCOUNTER — Encounter: Payer: BLUE CROSS/BLUE SHIELD | Admitting: Family Medicine

## 2020-12-29 ENCOUNTER — Encounter: Payer: BC Managed Care – PPO | Admitting: Family Medicine

## 2021-01-30 ENCOUNTER — Encounter: Payer: BC Managed Care – PPO | Admitting: Family Medicine

## 2021-04-03 ENCOUNTER — Encounter: Payer: 59 | Admitting: Family Medicine

## 2021-04-27 ENCOUNTER — Other Ambulatory Visit: Payer: Self-pay | Admitting: Family Medicine

## 2021-04-28 MED ORDER — AMPHETAMINE-DEXTROAMPHETAMINE 10 MG PO TABS: 10.0000 mg | ORAL_TABLET | Freq: Two times a day (BID) | ORAL | 0 refills | Status: DC

## 2021-04-28 NOTE — Telephone Encounter (Signed)
William Parsons team please review and load x3 if appropriate with needed info

## 2021-07-08 ENCOUNTER — Encounter: Payer: 59 | Admitting: Family Medicine

## 2021-08-24 ENCOUNTER — Telehealth: Payer: Self-pay | Admitting: Family Medicine

## 2021-08-25 MED ORDER — AMPHETAMINE-DEXTROAMPHETAMINE 10 MG PO TABS: 10.0000 mg | ORAL_TABLET | Freq: Two times a day (BID) | ORAL | 0 refills | Status: DC

## 2021-08-25 NOTE — Telephone Encounter (Addendum)
Patient is scheduled for 9/16, needing another refill to last until appointment.

## 2021-08-25 NOTE — Telephone Encounter (Signed)
Last seen October of last year. Needs visit before refill. Looks like missed visit in July potentially?

## 2021-08-27 NOTE — Telephone Encounter (Signed)
He has not followed up as instructed. I already sent in an rx [place] [facility]- he can pick up from current pharmacy unless that pharmacy is out of the medicine

## 2021-08-27 NOTE — Telephone Encounter (Signed)
Pt requesting refills for Adderall 10 mg BID.  Last OV 09/26/2020. I have loaded the cart with correct pharmacy.

## 2021-08-27 NOTE — Telephone Encounter (Signed)
MEDICATION: amphetamine-dextroamphetamine (ADDERALL) 10 MG tablet  PHARMACY:  [id number] - [place], [place] - [address]    Comments: [facility] and would like it sent to this pharmacy instead.             **Let patient know to contact pharmacy at the end of the day to make sure medication is ready. **  ** Please notify patient to allow 48-72 hours to process**  **Encourage patient to contact the pharmacy for refills or they can request refills through [facility]**

## 2021-08-27 NOTE — Addendum Note (Signed)
Addended by: Jimmye Norman on: 08/27/2021 04:29 PM   Modules accepted: Orders

## 2021-08-28 NOTE — Telephone Encounter (Signed)
Pt is scheduled to see you 9/16.

## 2021-09-03 NOTE — Telephone Encounter (Signed)
Patient is calling in stating that he has several issues with the [facility], he states there is a shortage of adderall at there location. [facility] and only has two [facility] left.   Pt also requests to take [facility] [place] taking out of his chart.

## 2021-09-04 ENCOUNTER — Telehealth: Payer: Self-pay | Admitting: Family Medicine

## 2021-09-04 MED ORDER — AMPHETAMINE-DEXTROAMPHETAMINE 10 MG PO TABS: 10.0000 mg | ORAL_TABLET | Freq: Two times a day (BID) | ORAL | 0 refills | Status: DC

## 2021-09-04 NOTE — Telephone Encounter (Signed)
Covering for patient's PCP who is out of the office.  I reviewed  previous refill rx notes from patient's PCP - a short supply was sent in last week. Was advised that patient's pharmacy did not have stock of adderall and he requested it be sent to another pharmacy.   Seven day supply was sent in. Database did not have any red flags. He will need to keep f/u appointment with pcp next week.  Katina Degree. Jimmey Ralph, MD 09/04/2021 4:48 PM

## 2021-09-11 ENCOUNTER — Encounter: Payer: 59 | Admitting: Family Medicine

## 2021-09-14 NOTE — Progress Notes (Signed)
Phone: [phone]    Subjective:  Patient presents today for their annual physical. Chief complaint-noted.   See problem oriented charting- ROS- full  review of systems was completed and negative  except for: nasal congestion and sinus pressure for 2.5 weeks  The following were reviewed and entered/updated in epic: Past Medical History:  Diagnosis Date   Acne    Concussion 01/2012   seen by neurology   Esophagitis due to doxycycline 2013   Fracture of radius with ulna, right, closed 12/2012   Right shoulder injury 2013   seen at [facility]   Patient Active Problem List   Diagnosis Date Noted   ADHD (attention deficit hyperactivity disorder), combined type 12/01/2013    Priority: High   Mild concussion 09/21/2015    Priority: Low   Acne 03/15/2013    Priority: Low   Back pain 03/14/2013    Priority: Low   Esophagitis due to doxycycline 10/31/2012    Priority: Low   Overweight 01/03/2018   Past Surgical History:  Procedure Laterality Date   WISDOM TOOTH EXTRACTION Bilateral 12/29/2017    Family History  Problem Relation Age of Onset   Learning disabilities Mother    Healthy Father    Healthy Sister    Healthy Brother    Learning disabilities Maternal Grandmother    Diabetes Maternal Grandfather    Diabetes Paternal Grandmother    Diabetes Paternal Grandfather     Medications- reviewed and updated Current Outpatient Medications  Medication Sig Dispense Refill   amphetamine-dextroamphetamine (ADDERALL) 10 MG tablet Take 1 tablet (10 mg total) by mouth 2 (two) times daily. 60 tablet 0   amphetamine-dextroamphetamine (ADDERALL) 10 MG tablet Take 1 tablet (10 mg total) by mouth 2 (two) times daily. 60 tablet 0   amphetamine-dextroamphetamine (ADDERALL) 10 MG tablet Take 1 tablet (10 mg total) by mouth 2 (two) times daily with a meal. 14 tablet 0   No current facility-administered medications for this visit.    Allergies-reviewed and updated Allergies  Allergen  Reactions   Doxycycline     esophagitis    Social History   Social History Narrative   Single. At home in summers. 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    Lives at home with mom, dad, little brother and little sister      At [employer]- senior year fall 2021.    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 Oropharynx normal Neck: no thyromegaly CV: RRR no murmurs rubs or gallops Lungs: CTAB no crackles, wheeze, rhonchi Abdomen: soft/nontender/nondistended/normal bowel sounds. No rebound or guarding.  Ext: no edema Skin: warm, dry Neuro: grossly normal, moves all extremities, PERRLA     Assessment and Plan:  25 y.o. male presenting for annual physical.  Health Maintenance counseling: 1. Anticipatory guidance: Patient counseled regarding regular dental exams -q6 months, eye exams - slight astigmatism- uses readers  but no regular visits,  avoiding smoking and second hand smoke, limiting alcohol to 2 beverages per day- 3-4 x a week with 3-5 drinks - would recommend max 14 a week - even lower would help with weight, no illicit drugs- intermittent marijuana in past.   2. Risk factor reduction:  Advised patient of need for regular exercise and diet rich and fruits and vegetables to reduce risk of heart attack and stroke. Exercise- trying to exercise again- a month ago set goal of 100 miles running before allowing himself to have alcohol- running or walking that in the mornings (has done pretty  good overall with this- just 3 lapses). Diet-discussed improving diet.  Wt Readings from Last 3 Encounters:  09/18/21 207 lb 3.2 oz (94 kg)  09/26/20 207 lb (93.9 kg)  07/28/20 199 lb 3.2 oz (90.4 kg)   3. Immunizations/screenings/ancillary studies DISCUSSED:  -TDAP vaccination repeat planned (last one 12/09) - opts in -Flu vaccination repeat planned (last one 08/13) - opts in Immunization History  Administered Date(s) Administered   DTaP 02/11/1998, 04/11/1998, 06/12/1998, 03/12/1999, 12/14/2002   HPV Quadrivalent 03/03/2011, 05/04/2011, 08/16/2012   Hepatitis B 05-15-1997, 02/11/1998, 09/10/1998   HiB (PRP-OMP) 02/11/1998, 04/11/1998, 03/12/1999   IPV 02/11/1998, 04/11/1998, 12/10/1998, 12/14/2002   Influenza Nasal 11/28/2007, 08/16/2012   Influenza Split 11/24/2005, 11/19/2008   MMR 12/10/1998, 12/14/2002   Meningococcal Conjugate 12/11/2008   PFIZER(Purple Top)SARS-COV-2 Vaccination 07/29/2020, 12/13/2020   Pneumococcal Conjugate-13 06/11/1999, 08/20/1999   Rotavirus Pentavalent 02/11/1998, 04/11/1998, 06/12/1998   Tdap 12/11/2008   Varicella 07/10/2013     Family History  Problem Relation Age of Onset   Learning disabilities Mother    Healthy Father    Healthy Sister    Healthy Brother    Learning disabilities Maternal Grandmother    Diabetes Maternal Grandfather    Diabetes Paternal Grandmother    Diabetes Paternal Grandfather   4. Prostate cancer screening- no family history, start at age 18 5. Colon cancer screening - no family history, start at age 44 6. Skin cancer screening/prevention- does not see derm. advised regular sunscreen use. Denies worrisome, changing, or new skin lesions.  7. Testicular cancer screening- advised monthly self exams  8. STD screening- patient opts out- not dating at moment- and never had unprotected sex and then not been tested 87. Smoking associated screening- never smoker  Status of chronic or acute concerns   # ADD S:control:   reports good control when on meds- with routine has been one of best fits he has had to this point. Last year was on a gap year from school- currently started back- final year of school This semester has been going really well for him. . Will finish in December with [employer]! 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 Considering [place] prior- now leaning toward [place] medication:  adderal 10 mg BID -Original diagnosis January 2015 [facility] -Initially managed by Dr. Laney Pastor -Controlled substance contract January 03, 2018 per my note - reviewed PDMP  -If consumes alcohol-skip a dose of adderall or wait at least 6 hours after dose to drink - in regards to UDS- normal 09/2020 In the past patient reported using CBD Gummies-his UDS was positive for marijuana.  He was instructed to discontinue CBD Gummies in case there is cross-reactivity.  Plan was for repeat UDS which was negative for marijuana.  Patient reported excellent control at time of last visit  Since the last visit has the patient had any:  Appetite changes? No Unintentional weight loss?No Is medication working well ? Yes Does patient take drug holidays? Yes only weekends if doesn't have to do work or drinking Difficulties falling to sleep or maintaining sleep? No Any anxiety?  No Any cardiac issues (fainting or paliptations)?No Suicidal thoughts? No Changes in health since last visit? No New medications? No Has the patient taken his medication today? No A/P: ADD has been well controlled.  Patient has had intermittent marijuana and we discussed needs to remain off of that.  We will check urine drug screen-this has been sparing and if test is negative and patient continues not to consume marijuana at this point will refill medicines.  We discussed if positive would need to discontinue and have a negative urine drug screen before we refill and encouraged long-term  cessation - [facility] [address] [facility] [place]  # weight gain- encouraged him to get back under 200 by follow up- planned to restart exercise  #mild nasal congestion for 2-3 weeks. Started after [employer]- app game. Been getting better. Occasional mild sinus pressure. We discussed if this worsens at this point should call us and could consider antitibiotic. Try claritin/allegra before bed  Recommended follow up: Return in about 6 months (around 03/18/2022) for follow up- or sooner if needed.  Lab/Order associations:NOT fasting- offered updated cbc, cmp, lipid- mild HLD- wants to hold off on now and work on diet/exercise and recheck next year   ICD-10-CM   1. Preventative health care  Z00.00     2. Attention deficit disorder (ADD) in adult  F98.8 DRUG MONITORING, PANEL 8 WITH CONFIRMATION, URINE    3. High risk medication use  Z79.899 DRUG MONITORING, PANEL 8 WITH CONFIRMATION, URINE     I,Jada Bradford,acting as a scribe for Garret Reddish, MD.,have documented all relevant documentation on the behalf of Garret Reddish, MD,as directed by  Garret Reddish, MD while in the presence of Garret Reddish, MD.  I, Garret Reddish, MD, have reviewed all documentation for this visit. The documentation on 09/18/21 for the exam, diagnosis, procedures, and orders are all accurate and complete.  Return precautions advised.  Garret Reddish, MD

## 2021-09-18 ENCOUNTER — Ambulatory Visit (INDEPENDENT_AMBULATORY_CARE_PROVIDER_SITE_OTHER): Payer: 59 | Admitting: Family Medicine

## 2021-09-18 ENCOUNTER — Encounter: Payer: Self-pay | Admitting: Family Medicine

## 2021-09-18 ENCOUNTER — Other Ambulatory Visit: Payer: Self-pay

## 2021-09-18 VITALS — BP 137/88 | HR 71 | Temp 98.4°F | Ht 71.0 in | Wt 207.2 lb

## 2021-09-18 DIAGNOSIS — Z23 Encounter for immunization: Secondary | ICD-10-CM | POA: Diagnosis not present

## 2021-09-18 DIAGNOSIS — Z0000 Encounter for general adult medical examination without abnormal findings: Secondary | ICD-10-CM

## 2021-09-18 DIAGNOSIS — Z79899 Other long term (current) drug therapy: Secondary | ICD-10-CM | POA: Diagnosis not present

## 2021-09-18 DIAGNOSIS — F988 Other specified behavioral and emotional disorders with onset usually occurring in childhood and adolescence: Secondary | ICD-10-CM

## 2021-09-18 NOTE — Addendum Note (Signed)
Addended by: Daryll Brod on: 09/18/2021 01:32 PM   Modules accepted: Orders

## 2021-09-18 NOTE — Patient Instructions (Addendum)
Health Maintenance Due  Topic Date Due   TETANUS/TDAP Done today in office.  12/11/2018   INFLUENZA VACCINE Done today in office.  07/27/2021   Please stop by lab before you go If you have mychart- we will send your results within 3 business days of [place] receiving them.  If you do not have mychart- we will call you about results within 5 business days of [place] receiving them.  *please also note that you will see labs on mychart as soon as they post. I will later go in and write notes on them- will say "notes from Dr. Durene Cal"  Recommended follow up: Return in about 6 months (around 03/18/2022) for follow up- or sooner if needed.

## 2021-09-23 ENCOUNTER — Telehealth: Payer: Self-pay

## 2021-09-23 LAB — DRUG MONITORING, PANEL 8 WITH CONFIRMATION, URINE
6 Acetylmorphine: NEGATIVE ng/mL (ref <10)
Alcohol Metabolites: NEGATIVE ng/mL (ref <500)
Amphetamines: NEGATIVE ng/mL (ref <500)
Benzodiazepines: NEGATIVE ng/mL (ref <100)
Buprenorphine, Urine: NEGATIVE ng/mL (ref <5)
Cocaine Metabolite: NEGATIVE ng/mL (ref <150)
Creatinine: 13.8 mg/dL — ABNORMAL LOW (ref >=20.0)
MDMA: NEGATIVE ng/mL (ref <500)
Marijuana Metabolite: 10 ng/mL — ABNORMAL HIGH (ref <5)
Marijuana Metabolite: POSITIVE ng/mL — AB (ref <20)
Opiates: NEGATIVE ng/mL (ref <100)
Oxidant: NEGATIVE ug/mL (ref <200)
Oxycodone: NEGATIVE ng/mL (ref <100)
Specific Gravity: 1 — ABNORMAL LOW (ref >=1.003)
pH: 6.9 (ref 4.5–9.0)

## 2021-09-23 LAB — DM TEMPLATE

## 2021-09-23 NOTE — Telephone Encounter (Signed)
Patient concerned over UDS. Wanting to know if there is anything he can do to continue getting adderall. Asked if he could take another test in a month. I said that I would ask, but highly doubt him being able to retake UDS because it would not be random urine screen.

## 2021-09-23 NOTE — Telephone Encounter (Signed)
Pt would like a call to discuss his lab results. Please Advise.

## 2021-09-23 NOTE — Telephone Encounter (Signed)
We could do a referral to psychiatry-alternatively if primary care is going to prescribe would need to have a negative UDS before each individual monthly refill so he would need to do a urine on a monthly basis to show sustained abstinence

## 2021-09-23 NOTE — Telephone Encounter (Signed)
Unable to reach patient. LMTRC  

## 2022-02-22 NOTE — Progress Notes (Incomplete)
? ?Phone [phone] ?In person visit ?  ?Subjective:  ? ?William Parsons is a 26 y.o. year old very pleasant male patient who presents for/with See problem oriented charting ?No chief complaint on file. ? ? ?This visit occurred during the SARS-CoV-2 public health emergency.  Safety protocols were in place, including screening questions prior to the visit, additional usage of staff PPE, and extensive cleaning of exam room while observing appropriate contact time as indicated for disinfecting solutions.  ? ?Past Medical History-  ?Patient Active Problem List  ? Diagnosis Date Noted  ? Overweight 01/03/2018  ? Mild concussion 09/21/2015  ? ADHD (attention deficit hyperactivity disorder), combined type 12/01/2013  ? Acne 03/15/2013  ? Back pain 03/14/2013  ? Esophagitis due to doxycycline 10/31/2012  ? ? ?Medications- reviewed and updated ?Current Outpatient Medications  ?Medication Sig Dispense Refill  ? amphetamine-dextroamphetamine (ADDERALL) 10 MG tablet Take 1 tablet (10 mg total) by mouth 2 (two) times daily. 60 tablet 0  ? amphetamine-dextroamphetamine (ADDERALL) 10 MG tablet Take 1 tablet (10 mg total) by mouth 2 (two) times daily. 60 tablet 0  ? amphetamine-dextroamphetamine (ADDERALL) 10 MG tablet Take 1 tablet (10 mg total) by mouth 2 (two) times daily with a meal. 14 tablet 0  ? ?No current facility-administered medications for this visit.  ? ?  ?Objective:  ?There were no vitals taken for this visit. ?Gen: NAD, resting comfortably ?CV: RRR no murmurs rubs or gallops ?Lungs: CTAB no crackles, wheeze, rhonchi ?Abdomen: soft/nontender/nondistended/normal bowel sounds. No rebound or guarding.  ?Ext: no edema ?Skin: warm, dry ?Neuro: grossly normal, moves all extremities ? ?*** ?  ? ?Assessment and Plan  ? ?***Eliminate CBD Gummies- ? ?# ADD ?S:control:  reported good control when on meds- with routine had been one of best fits he had to this point. Last year was on a gap year from school- currently  started back- final year of school This semester had been going really well for him. . Will finish in December with [employer]! 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 Considered [place] prior- was leaning toward [place] ?medication:  adderal 10 mg BID ?-Original diagnosis January 2015 [facility] ?-Initially managed by Dr. Laney Pastor ?-Controlled substance contract January 03, 2018 per my note ?- reviewed PDMP  ?-If consumes alcohol-skip a dose of adderall or wait at least 6 hours after dose to drink ?- in regards to UDS- normal 09/2020 ?In the past patient reported using CBD Gummies-his UDS was positive for marijuana.  He was instructed to discontinue CBD Gummies in case there is cross-reactivity.  Plan was for repeat UDS which was negative for marijuana.  Patient reported excellent control at time of last visit ?  ?Since the last visit has the patient had any: ?Appetite changes? No *** ?Unintentional weight loss?No *** ?Is medication working well ? Yes *** ?Does patient take drug holidays? Yes only weekends if doesn't have to do work or drinking *** ?Difficulties falling to sleep or maintaining sleep? No *** ?Any anxiety?  No *** ?Any cardiac issues (fainting or paliptations)?No *** ?Suicidal thoughts? No *** ?Changes in health since last visit? No *** ?New medications? No *** ?Has the patient taken his medication today? No *** ?A/P: *** ? ?# weight gain- encouraged him to get back under 200 by follow up- planned to restart exercise ?  ?#mild nasal congestion for 2-3 weeks. Started after [employer]- [employer]. Been getting better. Occasional mild sinus pressure. We discussed  if this worsened at this point should call us and could consider antitibiotic. Try claritin/allegra before bed ?  ?Health Maintenance Due  ?Topic Date Due  ? COVID-19 Vaccine (3 - Pfizer risk series) 01/10/2021  ? ?Recommended follow up: No follow-ups on file. ?Future Appointments   ?Date Time Provider [place]  ?02/24/2022 11:00 AM Marin Olp, MD [facility]  ?09/21/2022  9:40 AM Yong Channel Brayton Mars, MD [facility]  ? ? ?Lab/Order associations: ?No diagnosis found. ? ?No orders of the defined types were placed in this encounter. ? ? ?I,Jada Bradford,acting as a scribe for Garret Reddish, MD.,have documented all relevant documentation on the behalf of Garret Reddish, MD,as directed by  Garret Reddish, MD while in the presence of Garret Reddish, MD. ? ? ?*** ?Return precautions advised.  ?Burnett Corrente ? ? ?

## 2022-02-24 ENCOUNTER — Ambulatory Visit: Payer: 59 | Admitting: Family Medicine

## 2022-02-24 DIAGNOSIS — F988 Other specified behavioral and emotional disorders with onset usually occurring in childhood and adolescence: Secondary | ICD-10-CM

## 2022-07-08 ENCOUNTER — Ambulatory Visit: Payer: Self-pay | Admitting: Family Medicine

## 2022-09-20 ENCOUNTER — Encounter: Payer: Self-pay | Admitting: *Deleted

## 2022-09-21 ENCOUNTER — Encounter: Payer: Self-pay | Admitting: Family Medicine

## 2022-09-22 ENCOUNTER — Telehealth: Payer: Self-pay | Admitting: Family Medicine

## 2022-09-22 NOTE — Telephone Encounter (Signed)
Pt called to sched a new pt appt with Dr. Sarajane Jews; let pt know that Dr. Sarajane Jews is not accepting New pts at this time. Pt informed me that he got the okay from Dr. Sarajane Jews daughter to be sched with him. Let pt know that I will send a msg back to get the okay from Dr. Sarajane Jews and his current PCP.  Please advise.

## 2022-09-23 NOTE — Telephone Encounter (Signed)
See below message.Please advise

## 2022-09-23 NOTE — Telephone Encounter (Signed)
FYI

## 2022-09-23 NOTE — Telephone Encounter (Signed)
Yes I agreed to see him  

## 2022-09-23 NOTE — Telephone Encounter (Signed)
Pt has been scheduled for TOC  per Dr Sarajane Jews

## 2022-09-23 NOTE — Telephone Encounter (Signed)
Ok with me- tell him we will miss him and wish him the best in the future

## 2022-09-27 ENCOUNTER — Encounter: Payer: Self-pay | Admitting: Family Medicine

## 2022-09-27 ENCOUNTER — Ambulatory Visit (INDEPENDENT_AMBULATORY_CARE_PROVIDER_SITE_OTHER): Payer: Commercial Managed Care - HMO | Admitting: Family Medicine

## 2022-09-27 VITALS — BP 110/74 | HR 65 | Temp 98.3°F | Ht 70.5 in | Wt 205.0 lb

## 2022-09-27 DIAGNOSIS — F902 Attention-deficit hyperactivity disorder, combined type: Secondary | ICD-10-CM

## 2022-09-27 MED ORDER — AMPHETAMINE-DEXTROAMPHETAMINE 10 MG PO TABS: 10.0000 mg | ORAL_TABLET | Freq: Two times a day (BID) | ORAL | 0 refills | Status: DC

## 2022-09-27 NOTE — Progress Notes (Signed)
   Subjective:    Patient ID: William Parsons, male    DOB: 1995/12/31, 26 y.o.   MRN: [id number]  HPI Here to establish after transferring from Dr. Yong Channel. He feels well but he asks for refill son Adderall. He was diagnosed with ADHD after testing at age 42, and he has been on treatment since then. He tried Vyvanse and Concerta, but he di not do well on extended release medications. Now he takes immediate release Adderall, and this work very well for him. He graduated from [employer] with a degree in Engineer, technical sales. He is currently looking for a job.    Review of Systems  Constitutional: Negative.   Respiratory: Negative.    Cardiovascular: Negative.   Neurological: Negative.   Psychiatric/Behavioral:  Positive for decreased concentration. Negative for agitation, behavioral problems, confusion, dysphoric mood, hallucinations and sleep disturbance. The patient is not nervous/anxious and is not hyperactive.        Objective:   Physical Exam Constitutional:      Appearance: Normal appearance.  Cardiovascular:     Rate and Rhythm: Normal rate and regular rhythm.     Pulses: Normal pulses.     Heart sounds: Normal heart sounds.  Pulmonary:     Effort: Pulmonary effort is normal.     Breath sounds: Normal breath sounds.  Neurological:     General: No focal deficit present.     Mental Status: He is alert and oriented to person, place, and time.  Psychiatric:        Mood and Affect: Mood normal.        Behavior: Behavior normal.        Thought Content: Thought content normal.           Assessment & Plan:  Intro visit for this patient with ADHD. We will restart him on Adderall 10 mg BID. Recheck in 3 months.  Alysia Penna, MD

## 2022-11-11 ENCOUNTER — Telehealth: Payer: Self-pay | Admitting: Family Medicine

## 2022-11-11 NOTE — Telephone Encounter (Signed)
Pt called to request a refill of the following:  amphetamine-dextroamphetamine (ADDERALL) 10 MG tablet   LOV:  09/27/22 = TOC  [id number] - , [place] - [address] AT [facility] OF [address] Phone: [phone]  Fax: [phone]

## 2022-11-11 NOTE — Telephone Encounter (Signed)
Pt LOV was on 09/27/22 Last refill was done on 09/27/22 Please advise

## 2022-11-12 NOTE — Telephone Encounter (Signed)
He already has refills until 12-27-22

## 2022-11-12 NOTE — Telephone Encounter (Signed)
Called patient message given. 

## 2023-03-04 ENCOUNTER — Ambulatory Visit (INDEPENDENT_AMBULATORY_CARE_PROVIDER_SITE_OTHER): Payer: 59 | Admitting: Family Medicine

## 2023-03-04 ENCOUNTER — Encounter: Payer: Self-pay | Admitting: Family Medicine

## 2023-03-04 VITALS — BP 126/80 | HR 89 | Temp 99.0°F | Wt 213.0 lb

## 2023-03-04 DIAGNOSIS — F902 Attention-deficit hyperactivity disorder, combined type: Secondary | ICD-10-CM

## 2023-03-04 MED ORDER — AMPHETAMINE-DEXTROAMPHETAMINE 10 MG PO TABS: 10.0000 mg | ORAL_TABLET | Freq: Two times a day (BID) | ORAL | 0 refills | Status: DC

## 2023-03-04 NOTE — Progress Notes (Signed)
   Subjective:    Patient ID: William Parsons, male    DOB: [birth year], 27 y.o.   MRN: [id number]  HPI Here to follow up on ADHD. He is doing well and is pleased with the medication. 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 He enjoys the job.    Review of Systems  Constitutional: Negative.   Respiratory: Negative.    Cardiovascular: Negative.   Neurological: Negative.        Objective:   Physical Exam Constitutional:      Appearance: Normal appearance.  Cardiovascular:     Rate and Rhythm: Normal rate and regular rhythm.     Pulses: Normal pulses.     Heart sounds: Normal heart sounds.  Pulmonary:     Effort: Pulmonary effort is normal.     Breath sounds: Normal breath sounds.  Neurological:     Mental Status: He is alert.           Assessment & Plan:  ADHD, stable. The Adderall is refilled.  Alysia Penna, MD

## 2023-06-21 ENCOUNTER — Encounter: Payer: Self-pay | Admitting: Family Medicine

## 2023-06-21 ENCOUNTER — Ambulatory Visit: Payer: 59 | Admitting: Family Medicine

## 2023-06-21 VITALS — BP 124/80 | HR 91 | Temp 98.6°F | Wt 208.0 lb

## 2023-06-21 DIAGNOSIS — M545 Low back pain, unspecified: Secondary | ICD-10-CM | POA: Diagnosis not present

## 2023-06-21 DIAGNOSIS — F902 Attention-deficit hyperactivity disorder, combined type: Secondary | ICD-10-CM

## 2023-06-21 LAB — POC URINALSYSI DIPSTICK (AUTOMATED)
Bilirubin, UA: NEGATIVE
Blood, UA: NEGATIVE
Glucose, UA: NEGATIVE
Ketones, UA: NEGATIVE
Leukocytes, UA: NEGATIVE
Nitrite, UA: NEGATIVE
Protein, UA: NEGATIVE
Spec Grav, UA: <=1.005 — AB (ref 1.010–1.025)
Urobilinogen, UA: 0.2 E.U./dL
pH, UA: 7.5 (ref 5.0–8.0)

## 2023-06-21 MED ORDER — AMPHETAMINE-DEXTROAMPHETAMINE 15 MG PO TABS: 15.0000 mg | ORAL_TABLET | Freq: Two times a day (BID) | ORAL | 0 refills | Status: DC

## 2023-06-21 NOTE — Progress Notes (Signed)
   Subjective:    Patient ID: William Parsons, male    DOB: 02/01/1996, 27 y.o.   MRN: [id number]  HPI Here for 2 issues. First he has had a sharp pain in the left middle back for the past 3 days. Sometimes the pain radiates around the left flank. 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 No urinary symptoms. The pain is actually much better today. We also have been treating his ADHD. He did well with Adderall 10 mg BID, but he asks to increase the dose. No side effects to report.    Review of Systems  Constitutional: Negative.   Respiratory: Negative.    Cardiovascular: Negative.   Gastrointestinal: Negative.   Genitourinary:  Positive for flank pain. Negative for dysuria, frequency, hematuria and urgency.  Musculoskeletal:  Positive for back pain.  Psychiatric/Behavioral:  Positive for decreased concentration.        Objective:   Physical Exam Constitutional:      General: He is not in acute distress.    Appearance: Normal appearance.  Cardiovascular:     Rate and Rhythm: Normal rate and regular rhythm.     Pulses: Normal pulses.     Heart sounds: Normal heart sounds.  Pulmonary:     Effort: Pulmonary effort is normal.     Breath sounds: Normal breath sounds.  Abdominal:     General: Abdomen is flat. Bowel sounds are normal. There is no distension.     Palpations: Abdomen is soft. There is no mass.     Tenderness: There is no abdominal tenderness. There is no right CVA tenderness, left CVA tenderness, guarding or rebound.     Hernia: No hernia is present.  Musculoskeletal:     Comments: His back is not tender, ROM is full   Neurological:     General: No focal deficit present.     Mental Status: He is alert and oriented to person, place, and time.           Assessment & Plan:  His flank pain is consistent with a muscle strain, and it appears to be healing as expected. He will return if it gets worse again. Also  for his ADHD, we will increase the Adderall to 15 mg BID. Gershon Crane, MD

## 2023-06-21 NOTE — Addendum Note (Signed)
Addended by: Carola Rhine on: 06/21/2023 05:20 PM   Modules accepted: Orders

## 2023-08-16 ENCOUNTER — Encounter: Payer: Self-pay | Admitting: Family Medicine

## 2023-08-16 ENCOUNTER — Ambulatory Visit (INDEPENDENT_AMBULATORY_CARE_PROVIDER_SITE_OTHER): Payer: 59 | Admitting: Family Medicine

## 2023-08-16 VITALS — BP 120/78 | HR 77 | Temp 98.2°F | Wt 215.0 lb

## 2023-08-16 DIAGNOSIS — B356 Tinea cruris: Secondary | ICD-10-CM | POA: Diagnosis not present

## 2023-08-16 MED ORDER — KETOCONAZOLE 2 % EX CREA: 1.0000 | TOPICAL_CREAM | Freq: Every day | CUTANEOUS | 2 refills | Status: AC

## 2023-08-16 NOTE — Progress Notes (Signed)
   Subjective:    Patient ID: William Parsons, male    DOB: 10/12/1996, 27 y.o.   MRN: [id number]  HPI Here for several weeks of an itchy rash on the penis and scrotum that waxes and wanes.    Review of Systems  Constitutional: Negative.   Respiratory: Negative.    Cardiovascular: Negative.   Skin:  Positive for rash.       Objective:   Physical Exam Constitutional:      Appearance: Normal appearance.  Cardiovascular:     Rate and Rhythm: Normal rate and regular rhythm.     Pulses: Normal pulses.     Heart sounds: Normal heart sounds.  Pulmonary:     Effort: Pulmonary effort is normal.     Breath sounds: Normal breath sounds.  Skin:    Comments: Patches of pink macular skin on the penis shaft and scrotum  Neurological:     Mental Status: He is alert.           Assessment & Plan:  Tinea cruris, treat with Ketoconazole cream. Gershon Crane, MD

## 2023-10-24 ENCOUNTER — Other Ambulatory Visit: Payer: Self-pay | Admitting: Family Medicine

## 2023-10-24 MED ORDER — AMPHETAMINE-DEXTROAMPHETAMINE 15 MG PO TABS: 15.0000 mg | ORAL_TABLET | Freq: Two times a day (BID) | ORAL | 0 refills | Status: DC

## 2023-10-24 NOTE — Telephone Encounter (Signed)
Pt LOV was on 08/16/23 Last refill was done on 06/21/23 Please advise

## 2023-10-24 NOTE — Telephone Encounter (Signed)
Done

## 2024-03-30 ENCOUNTER — Encounter: Payer: Self-pay | Admitting: Family Medicine

## 2024-03-30 ENCOUNTER — Ambulatory Visit (INDEPENDENT_AMBULATORY_CARE_PROVIDER_SITE_OTHER): Admitting: Family Medicine

## 2024-03-30 VITALS — BP 128/78 | HR 91 | Temp 99.6°F | Ht 71.25 in | Wt 216.0 lb

## 2024-03-30 DIAGNOSIS — Z0000 Encounter for general adult medical examination without abnormal findings: Secondary | ICD-10-CM | POA: Diagnosis not present

## 2024-03-30 MED ORDER — AMPHETAMINE-DEXTROAMPHETAMINE 15 MG PO TABS: 15.0000 mg | ORAL_TABLET | Freq: Two times a day (BID) | ORAL | 0 refills | Status: DC

## 2024-03-30 NOTE — Progress Notes (Signed)
 Subjective:    Patient ID: William Parsons, male    DOB: [birth year], 28 y.o.   MRN: [id number]  HPI Here for a well exam. He feels fine. His Adderall still works well.    Review of Systems  Constitutional: Negative.   HENT: Negative.    Eyes: Negative.   Respiratory: Negative.    Cardiovascular: Negative.   Gastrointestinal: Negative.   Genitourinary: Negative.   Musculoskeletal: Negative.   Skin: Negative.   Neurological: Negative.   Psychiatric/Behavioral: Negative.         Objective:   Physical Exam Constitutional:      General: He is not in acute distress.    Appearance: Normal appearance. He is well-developed. He is not diaphoretic.  HENT:     Head: Normocephalic and atraumatic.     Right Ear: External ear normal.     Left Ear: External ear normal.     Nose: Nose normal.     Mouth/Throat:     Pharynx: No oropharyngeal exudate.  Eyes:     General: No scleral icterus.       Right eye: No discharge.        Left eye: No discharge.     Conjunctiva/sclera: Conjunctivae normal.     Pupils: Pupils are equal, round, and reactive to light.  Neck:     Thyroid: No thyromegaly.     Vascular: No JVD.     Trachea: No tracheal deviation.  Cardiovascular:     Rate and Rhythm: Normal rate and regular rhythm.     Pulses: Normal pulses.     Heart sounds: Normal heart sounds. No murmur heard.    No friction rub. No gallop.  Pulmonary:     Effort: Pulmonary effort is normal. No respiratory distress.     Breath sounds: Normal breath sounds. No wheezing or rales.  Chest:     Chest wall: No tenderness.  Abdominal:     General: Bowel sounds are normal. There is no distension.     Palpations: Abdomen is soft. There is no mass.     Tenderness: There is no abdominal tenderness. There is no guarding or rebound.  Genitourinary:    Penis: Normal. No tenderness.      Testes: Normal.     Prostate: Normal.     Rectum: Normal. Guaiac result negative.  Musculoskeletal:         General: No tenderness. Normal range of motion.     Cervical back: Neck supple.  Lymphadenopathy:     Cervical: No cervical adenopathy.  Skin:    General: Skin is warm and dry.     Coloration: Skin is not pale.     Findings: No erythema or rash.  Neurological:     General: No focal deficit present.     Mental Status: He is alert and oriented to person, place, and time.     Cranial Nerves: No cranial nerve deficit.     Motor: No abnormal muscle tone.     Coordination: Coordination normal.     Deep Tendon Reflexes: Reflexes are normal and symmetric. Reflexes normal.  Psychiatric:        Mood and Affect: Mood normal.        Behavior: Behavior normal.        Thought Content: Thought content normal.        Judgment: Judgment normal.           Assessment & Plan:  Well exam. We discussed diet and exercise.  Get fasting labs. Gershon Crane, MD

## 2024-04-04 ENCOUNTER — Other Ambulatory Visit (INDEPENDENT_AMBULATORY_CARE_PROVIDER_SITE_OTHER)

## 2024-04-04 DIAGNOSIS — Z0000 Encounter for general adult medical examination without abnormal findings: Secondary | ICD-10-CM | POA: Diagnosis not present

## 2024-04-04 LAB — HEMOGLOBIN A1C: Hgb A1c MFr Bld: 5.2 % (ref 4.6–6.5)

## 2024-04-26 ENCOUNTER — Other Ambulatory Visit

## 2024-04-27 ENCOUNTER — Other Ambulatory Visit (INDEPENDENT_AMBULATORY_CARE_PROVIDER_SITE_OTHER)

## 2024-04-27 ENCOUNTER — Telehealth: Payer: Self-pay | Admitting: Family Medicine

## 2024-04-27 DIAGNOSIS — Z0000 Encounter for general adult medical examination without abnormal findings: Secondary | ICD-10-CM | POA: Diagnosis not present

## 2024-04-27 LAB — BASIC METABOLIC PANEL WITH GFR
BUN: 16 mg/dL (ref 6–23)
CO2: 28 meq/L (ref 19–32)
Calcium: 9.7 mg/dL (ref 8.4–10.5)
Chloride: 104 meq/L (ref 96–112)
Creatinine, Ser: 0.93 mg/dL (ref 0.40–1.50)
GFR: 113.43 mL/min (ref >60.00)
Glucose, Bld: 106 mg/dL — ABNORMAL HIGH (ref 70–99)
Potassium: 4.5 meq/L (ref 3.5–5.1)
Sodium: 140 meq/L (ref 135–145)

## 2024-04-27 LAB — LIPID PANEL
Cholesterol: 186 mg/dL (ref 0–200)
HDL: 32.6 mg/dL — ABNORMAL LOW (ref >39.00)
LDL Cholesterol: 141 mg/dL — ABNORMAL HIGH (ref 0–99)
NonHDL: 152.91
Total CHOL/HDL Ratio: 6
Triglycerides: 61 mg/dL (ref 0.0–149.0)
VLDL: 12.2 mg/dL (ref 0.0–40.0)

## 2024-04-27 LAB — CBC WITH DIFFERENTIAL/PLATELET
Basophils Absolute: 0 10*3/uL (ref 0.0–0.1)
Basophils Relative: 0.6 % (ref 0.0–3.0)
Eosinophils Absolute: 0.3 10*3/uL (ref 0.0–0.7)
Eosinophils Relative: 4.5 % (ref 0.0–5.0)
HCT: 47.6 % (ref 39.0–52.0)
Hemoglobin: 16.1 g/dL (ref 13.0–17.0)
Lymphocytes Relative: 25.6 % (ref 12.0–46.0)
Lymphs Abs: 1.5 10*3/uL (ref 0.7–4.0)
MCHC: 33.8 g/dL (ref 30.0–36.0)
MCV: 92.9 fl (ref 78.0–100.0)
Monocytes Absolute: 0.5 10*3/uL (ref 0.1–1.0)
Monocytes Relative: 9.6 % (ref 3.0–12.0)
Neutro Abs: 3.4 10*3/uL (ref 1.4–7.7)
Neutrophils Relative %: 59.7 % (ref 43.0–77.0)
Platelets: 196 10*3/uL (ref 150.0–400.0)
RBC: 5.13 Mil/uL (ref 4.22–5.81)
RDW: 13.5 % (ref 11.5–15.5)
WBC: 5.7 10*3/uL (ref 4.0–10.5)

## 2024-04-27 LAB — HEPATIC FUNCTION PANEL
ALT: 65 U/L — ABNORMAL HIGH (ref 0–53)
AST: 23 U/L (ref 0–37)
Albumin: 4.8 g/dL (ref 3.5–5.2)
Alkaline Phosphatase: 55 U/L (ref 39–117)
Bilirubin, Direct: 0.1 mg/dL (ref 0.0–0.3)
Total Bilirubin: 0.5 mg/dL (ref 0.2–1.2)
Total Protein: 7.6 g/dL (ref 6.0–8.3)

## 2024-04-27 LAB — TSH: TSH: 1.31 u[IU]/mL (ref 0.35–5.50)

## 2024-04-27 NOTE — Telephone Encounter (Signed)
 Done

## 2024-04-30 ENCOUNTER — Encounter: Payer: Self-pay | Admitting: *Deleted

## 2024-08-21 ENCOUNTER — Encounter: Payer: Self-pay | Admitting: Family Medicine

## 2024-08-21 MED ORDER — AMPHETAMINE-DEXTROAMPHETAMINE 15 MG PO TABS: 15.0000 mg | ORAL_TABLET | Freq: Two times a day (BID) | ORAL | 0 refills | Status: AC

## 2024-08-21 NOTE — Telephone Encounter (Signed)
 Done

## 2024-12-04 ENCOUNTER — Encounter: Payer: Self-pay | Admitting: Family Medicine

## 2024-12-06 NOTE — Telephone Encounter (Signed)
 The form is ready
